# Patient Record
Sex: Male | Born: 2000
Health system: Southern US, Community
[De-identification: ages and names within clinical notes are randomized; demographics above are authoritative.]

## PROBLEM LIST (undated history)

## (undated) DIAGNOSIS — J210 Acute bronchiolitis due to respiratory syncytial virus: Secondary | ICD-10-CM

## (undated) DIAGNOSIS — L858 Other specified epidermal thickening: Secondary | ICD-10-CM

## (undated) HISTORY — PX: CIRCUMCISION: SUR203

## (undated) HISTORY — DX: Acute bronchiolitis due to respiratory syncytial virus: J21.0

## (undated) HISTORY — DX: Other specified epidermal thickening: L85.8

---
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
## 2021-03-12 DIAGNOSIS — R4182 Altered mental status, unspecified: Secondary | ICD-10-CM | POA: Diagnosis not present

## 2021-03-12 DIAGNOSIS — R000 Tachycardia, unspecified: Secondary | ICD-10-CM | POA: Diagnosis not present

## 2021-03-12 DIAGNOSIS — Z20822 Contact with and (suspected) exposure to covid-19: Secondary | ICD-10-CM | POA: Diagnosis not present

## 2021-03-12 DIAGNOSIS — F1210 Cannabis abuse, uncomplicated: Secondary | ICD-10-CM | POA: Diagnosis not present

## 2021-03-12 DIAGNOSIS — R58 Hemorrhage, not elsewhere classified: Secondary | ICD-10-CM | POA: Diagnosis not present

## 2021-03-12 DIAGNOSIS — T40715A Adverse effect of cannabis, initial encounter: Secondary | ICD-10-CM | POA: Diagnosis not present

## 2021-03-12 DIAGNOSIS — R5381 Other malaise: Secondary | ICD-10-CM | POA: Diagnosis not present

## 2021-03-12 DIAGNOSIS — R0902 Hypoxemia: Secondary | ICD-10-CM | POA: Diagnosis not present

## 2021-03-12 DIAGNOSIS — R55 Syncope and collapse: Secondary | ICD-10-CM | POA: Diagnosis not present

## 2021-03-18 ENCOUNTER — Ambulatory Visit: Payer: BC Managed Care – PPO | Admitting: Family Medicine

## 2021-03-18 ENCOUNTER — Telehealth: Payer: Self-pay | Admitting: Family Medicine

## 2021-03-18 NOTE — Telephone Encounter (Signed)
New patient appointment not needed; office visit needed to establish care.  ?

## 2021-03-18 NOTE — Telephone Encounter (Signed)
[facility] ; need to schedule new patient appointment.  ?

## 2021-03-24 ENCOUNTER — Ambulatory Visit: Payer: BC Managed Care – PPO | Admitting: Family Medicine

## 2021-03-24 ENCOUNTER — Other Ambulatory Visit: Payer: Self-pay

## 2021-03-24 ENCOUNTER — Encounter: Payer: Self-pay | Admitting: Family Medicine

## 2021-03-24 VITALS — BP 118/78 | HR 107 | Temp 97.2°F | Resp 18 | Ht 74.0 in | Wt 184.0 lb

## 2021-03-24 DIAGNOSIS — R55 Syncope and collapse: Secondary | ICD-10-CM | POA: Diagnosis not present
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
## 2021-04-07 NOTE — Progress Notes (Deleted)
?Cardiology Office Note:   ? ?Date:  04/07/2021  ? ?ID:  Darryl Curtis, DOB Jan 19, 2000, MRN [id number] ? ?PCP:  Susy Frizzle, MD ?  ?[facility] Providers ?Cardiologist:  None { ?Click to update primary MD,subspecialty MD or APP then REFRESH:1}   ? ?Referring MD: Susy Frizzle, MD  ? ?CC: *** ?Consulted for the evaluation of syncope at the behest of Susy Frizzle, MD ? ? ?History of Present Illness:   ? ?Darryl Curtis is a 21 y.o. male with a hx of syncope. ? ?Patient notes that he is feeling ***.  Has had no chest pain, chest pressure, chest tightness, chest stinging ***.  Discomfort occurs with ***, worsens with ***, and improves with ***.  Patient exertion notable for *** with *** and feels no symptoms.  No shortness of breath, DOE ***.  No PND or orthopnea***.  No weight gain***, leg swelling ***, or abdominal swelling***.  No syncope or near syncope ***. Notes *** no palpitations or funny heart beats.    ? ?Patient reports prior cardiac testing including *** ? ?No history of ***pre-eclampsia, gestation HTN or gestational DM.  No Fen-Phen or drug use***. ? ?Ambulatory BP ***. ? ? ?Past Medical History:  ?Diagnosis Date  ? Keratosis pilaris   ? Seen by Payton Mccallum- Amlactin  ? RSV bronchiolitis   ? 37 WEEKS of Age  ? ? ?Past Surgical History:  ?Procedure Laterality Date  ? CIRCUMCISION    ? ? ?Current Medications: ?No outpatient medications have been marked as taking for the 04/09/21 encounter (Appointment) with Werner Lean, MD.  ?  ? ?Allergies:   Patient has no known allergies.  ? ?Social History  ? ?Socioeconomic History  ? Marital status: Single  ?  Spouse name: Not on file  ? Number of children: Not on file  ? Years of education: Not on file  ? Highest education level: Not on file  ?Occupational History  ? Not on file  ?Tobacco Use  ? Smoking status: Former  ?  Types: E-cigarettes  ? Smokeless tobacco: Never  ? Tobacco comments:  ?  has "tried" cigarettes, ecigs, no daily/monthly use,  socially/infrequently  ?Vaping Use  ? Vaping Use: Former  ?Substance and Sexual Activity  ? Alcohol use: No  ?  Alcohol/week: 0.0 standard drinks  ? Drug use: No  ? Sexual activity: Never  ?Other Topics Concern  ? Not on file  ?Social History Narrative  ? Lives with parents and sister  ? ?Social Determinants of Health  ? ?Financial Resource Strain: Not on file  ?Food Insecurity: Not on file  ?Transportation Needs: Not on file  ?Physical Activity: Not on file  ?Stress: Not on file  ?Social Connections: Not on file  ?  ? ?Family History: ?The patient's family history includes Dementia in his maternal grandmother; Heart disease in his maternal grandfather; Hyperlipidemia in his maternal grandfather; Hypertension in his father and maternal grandfather; Stroke in his maternal grandfather. ?History of coronary artery disease notable for ***. ?History of heart failure notable for ***. ?History of arrhythmia notable for ***. ?Denies family history of sudden cardiac death including drowning, car accidents, or unexplained deaths in the family.*** ?No history of bicuspid aortic valve or aortic aneurysm or dissection.  *** ?No history of cardiomyopathies including hypertrophic cardiomyopathy, left ventricular non-compaction, or arrhythmogenic right ventricular cardiomyopathy.*** ? ?ROS:   ?Please see the history of present illness.    ?*** All other systems reviewed and are negative. ? ?  EKGs/Labs/Other Studies Reviewed:   ? ?The following studies were reviewed today: ? ?EKG:  EKG is *** ordered today.  The ekg ordered today demonstrates *** ?04/07/21: *** ?Recent Labs: ?No results found for requested labs within last 8760 hours.  ?Recent Lipid Panel ?No results found for: CHOL, TRIG, HDL, CHOLHDL, VLDL, LDLCALC, LDLDIRECT ? ?    ? ?Physical Exam:   ? ?VS:  There were no vitals taken for this visit.   ? ?Wt Readings from Last 3 Encounters:  ?03/24/21 184 lb (83.5 kg) (85 %, Z= 1.03)*  ?02/11/20 180 lb 6.4 oz (81.8 kg) (86 %, Z=  1.06)*  ?02/09/19 170 lb (77.1 kg) (82 %, Z= 0.93)*  ? ?* Growth percentiles are based on CDC (Boys, 2-20 Years) data.  ?  ? ?Gen: *** distress, *** obese/well nourished/malnourished   ?Neck: No JVD, *** carotid bruit ?Ears: Pilar Plate Sign ?Cardiac: No Rubs or Gallops, *** Murmur, ***cardia, *** radial pulses ?Respiratory: Clear to auscultation bilaterally, *** effort, ***  respiratory rate ?GI: Soft, nontender, non-distended *** ?MS: No *** edema; *** moves all extremities ?Integument: Skin feels *** ?Neuro:  At time of evaluation, alert and oriented to person/place/time/situation *** ?Psych: Normal affect, patient feels *** ? ? ?ASSESSMENT:   ? ?No diagnosis found. ?PLAN:   ? ?Syncope/Near Syncope *** ?- with/without orthostatic hypotension*** ? - diuretics management ? - SSRI's, TCAs ? - vasodilators (alpha blocker, nitrates, CCB) ? - with concomintant autonomic failure ***(pure autonomic failure, Parkinson disease, multiple system atrophy, Lewy body dementia, diabetes mellitus, amyloidosis, spinal cord injuries, autoimmune neuropathy (eg, Guillain-Barr?), paraneoplastic neuropathy) ?- with/without reflex medication hypotension ? - vasovagal syncope related to ***emotion, standing, climate *** ? - situational related to cough/sneeze/micturition/defecation/carotid stimulation *** ?-with risk of tachyarrhythmia*** ?-with risk of bradyarrhythmia*** ?-with risk of structural heart disease  ?Severe aortic stenosis ?Hypertrophic cardiomyopathy ?Cardiac tamponade ?Prosthetic valve dysfunction ?Congenital coronary anomalies ?Cardiac masses and tumors (eg, atrial myxoma) ?Pulmonary Embolism ?Pulmonary Arterial Hypertension ?- Will get Echocardiogram*** ?- Will get Live/NonLive ZioPatch or Preventice Monitor for *** ?- Will get POET for chronotropic incompetence ?- with perform ischemic evaluation: ? ? ? ? ?   ? ?{Are you ordering a CV Procedure (e.g. stress test, cath, DCCV, TEE, etc)?   Press F2        :[id number]   ? ? ?Medication Adjustments/Labs and Tests Ordered: ?Current medicines are reviewed at length with the patient today.  Concerns regarding medicines are outlined above.  ?No orders of the defined types were placed in this encounter. ? ?No orders of the defined types were placed in this encounter. ? ? ?There are no Patient Instructions on file for this visit.  ? ?Signed, ?Werner Lean, MD  ?04/07/2021 12:41 PM    ?[place] ? ?

## 2021-04-09 ENCOUNTER — Ambulatory Visit: Payer: BLUE CROSS/BLUE SHIELD | Admitting: Internal Medicine

## 2021-04-28 ENCOUNTER — Encounter: Payer: Self-pay | Admitting: *Deleted

## 2021-08-06 ENCOUNTER — Ambulatory Visit: Payer: BC Managed Care – PPO | Admitting: Family Medicine

## 2021-08-06 VITALS — BP 118/82 | Temp 97.7°F | Wt 198.0 lb

## 2021-08-06 DIAGNOSIS — L490 Exfoliation due to erythematous condition involving less than 10 percent of body surface: Secondary | ICD-10-CM | POA: Diagnosis not present

## 2021-08-06 DIAGNOSIS — L519 Erythema multiforme, unspecified: Secondary | ICD-10-CM | POA: Diagnosis not present

## 2021-08-06 MED ORDER — PREDNISONE 20 MG PO TABS: ORAL_TABLET | ORAL | 0 refills | Status: AC

## 2021-08-06 NOTE — Progress Notes (Signed)
Subjective:    Patient ID: Darryl Curtis, male    DOB: [birth year], 21 y.o.   MRN: [id number]  HPI  Patient is a very pleasant 21 year old Caucasian gentleman who presents today with a rash shown above on his elbow.  He recently got back from [place]  He is not taking any medications.  While he was in [place] he had a flulike prodrome.  He reports fatigue, headache, and malaise.  He took 1 dose of Imodium although he was not having any diarrhea.  He also took some Advil.  Shortly thereafter, he developed this rash.  He has similar lesions on his left antecubital fossa although not as pronounced.  He also has a few scattered lesions on his anterior left thigh.  There are no lesions on his torso or on his back or on his face.  He denies any cold sores.  He denies any history of genital ulcers.  He denies any cough.  He denies any shortness of breath.  Past Medical History:  Diagnosis Date   Keratosis pilaris    Seen by Payton Mccallum- Amlactin   RSV bronchiolitis    21 WEEKS of Age   Past Surgical History:  Procedure Laterality Date   CIRCUMCISION     No current outpatient medications on file prior to visit.   No current facility-administered medications on file prior to visit.   No Known Allergies Social History   Socioeconomic History   Marital status: Single    Spouse name: Not on file   Number of children: Not on file   Years of education: Not on file   Highest education level: Not on file  Occupational History   Not on file  Tobacco Use   Smoking status: Former    Types: E-cigarettes   Smokeless tobacco: Never   Tobacco comments:    has "tried" cigarettes, ecigs, no daily/monthly use, socially/infrequently  Vaping Use   Vaping Use: Former  Substance and Sexual Activity   Alcohol use: No    Alcohol/week: 0.0 standard drinks of alcohol   Drug use: No   Sexual activity: Never  Other Topics Concern   Not on file  Social History Narrative    Lives with parents and sister   Social Determinants of Health   Financial Resource Strain: Not on file  Food Insecurity: Not on file  Transportation Needs: Not on file  Physical Activity: Unknown (06/21/2017)   Exercise Vital Sign    Days of Exercise per Week: 5 days    Minutes of Exercise per Session: Not on file  Stress: No Stress Concern Present (06/21/2017)   [place]    Feeling of Stress : Not at all  Social Connections: Not on file  Intimate Partner Violence: Not on file   Family History  Problem Relation Age of Onset   Hypertension Father    Dementia Maternal Grandmother    Stroke Maternal Grandfather    Hypertension Maternal Grandfather    Hyperlipidemia Maternal Grandfather    Heart disease Maternal Grandfather      Review of Systems  All other systems reviewed and are negative.      Objective:   Physical Exam Vitals reviewed.  Constitutional:      General: He is not in acute distress.    Appearance: Normal appearance. He is normal weight. He is not ill-appearing, toxic-appearing or diaphoretic.  HENT:     Head: Normocephalic and atraumatic.  Nose: No congestion or rhinorrhea.  Neck:     Vascular: No carotid bruit.  Cardiovascular:     Rate and Rhythm: Normal rate and regular rhythm.     Pulses: Normal pulses.     Heart sounds: Normal heart sounds. No murmur heard.    No friction rub. No gallop.  Pulmonary:     Effort: Pulmonary effort is normal. No respiratory distress.     Breath sounds: Normal breath sounds. No wheezing, rhonchi or rales.  Abdominal:     General: Abdomen is flat. Bowel sounds are normal.     Palpations: Abdomen is soft.  Musculoskeletal:     Cervical back: Normal range of motion and neck supple.  Lymphadenopathy:     Cervical: No cervical adenopathy.  Skin:    Findings: Erythema and rash present.  Neurological:     Mental Status: He is alert.           Assessment & Plan:  Erythema multiforme involving less than 10% total body surface area Patient has no oral lesions.  There are no genital lesions.  Patient was not taking any medication.  Therefore I do not feel that this is a drug reaction.  Therefore the differential diagnosis includes a viral infection, mycoplasma, HSV.  He has no history of HSV.  Therefore I feel that this is most likely a reaction to a virus.  Begin prednisone for 6 days and await to see the patient's response.  Patient has no lesions on the palms.  Therefore secondary syphilis would be unlikely.  He has no history of tick bite.  Secondary early disseminated Lyme disease would be less likely.  Consider lab work for HSV, Lyme disease, and an RPR if rash persists.

## 2022-06-25 ENCOUNTER — Telehealth: Payer: Self-pay

## 2022-06-25 NOTE — Telephone Encounter (Signed)
LVM for patient to call back [phone], or to call PCP office to schedule follow up apt. AS, CMA  

## 2023-05-03 ENCOUNTER — Encounter: Payer: Self-pay | Admitting: Family Medicine

## 2023-05-03 ENCOUNTER — Ambulatory Visit: Admitting: Family Medicine

## 2023-05-03 VITALS — BP 120/76 | HR 82 | Temp 98.3°F | Ht 74.0 in | Wt 215.0 lb

## 2023-05-03 DIAGNOSIS — J069 Acute upper respiratory infection, unspecified: Secondary | ICD-10-CM

## 2023-05-03 DIAGNOSIS — J029 Acute pharyngitis, unspecified: Secondary | ICD-10-CM | POA: Diagnosis not present

## 2023-05-03 LAB — INFLUENZA A AND B AG, IMMUNOASSAY
INFLUENZA A ANTIGEN: NOT DETECTED
INFLUENZA B ANTIGEN: NOT DETECTED

## 2023-05-03 NOTE — Assessment & Plan Note (Addendum)
 No red flags on exam. No lymphadenopathy, rash, malaise, voice changes. Reassured patient that symptoms and exam findings are most consistent with a viral upper respiratory infection and explained lack of efficacy of antibiotics against viruses.  Discussed expected course and features suggestive of secondary bacterial infection.  Continue supportive care. Increase fluid intake with water or electrolyte solution like pedialyte. Encouraged acetaminophen  as needed for fever/pain. Encouraged salt water gargling, chloraseptic spray and throat lozenges. Follow up PRN

## 2023-05-03 NOTE — Progress Notes (Signed)
 Subjective:  HPI: Darryl Curtis is a 23 y.o. male presenting on 05/03/2023 for Sore Throat (Pt c/o sore throat. Pt states he has a hx of recurrent strep throat. Pt would also like to discuss a referral to an ENT. )   Sore Throat    Patient is in today for 24 hours sore throat that is painful to swallow and talk with subjective fever and chills. No voice changes, fever, body aches, cough, rhinorrhea, allergies. Does have history of recurrent strep throat.  No known sick exposures Has tried nothing.   Review of Systems  All other systems reviewed and are negative.   Relevant past medical history reviewed and updated as indicated.   Past Medical History:  Diagnosis Date   Keratosis pilaris    Seen by Ava Boatman- Amlactin   RSV bronchiolitis    55 WEEKS of Age     Past Surgical History:  Procedure Laterality Date   CIRCUMCISION      Allergies and medications reviewed and updated.   Current Outpatient Medications:    predniSONE  (DELTASONE ) 20 MG tablet, 3 tabs poqday 1-2, 2 tabs poqday 3-4, 1 tab poqday 5-6 (Patient not taking: Reported on 05/03/2023), Disp: 12 tablet, Rfl: 0  No Known Allergies  Objective:   BP 120/76   Pulse 82   Temp 98.3 F (36.8 C)   Ht 6\' 2"  (1.88 m)   Wt 215 lb (97.5 kg)   SpO2 98%   BMI 27.60 kg/m      05/03/2023   11:49 AM 08/06/2021   12:32 PM 03/24/2021    9:06 AM  Vitals with BMI  Height 6\' 2"   6\' 2"   Weight 215 lbs 198 lbs 184 lbs  BMI 27.59  23.61  Systolic 120 118 130  Diastolic 76 82 78  Pulse 82  107     Physical Exam Vitals and nursing note reviewed.  Constitutional:      Appearance: Normal appearance. He is normal weight.  HENT:     Head: Normocephalic and atraumatic.     Right Ear: Tympanic membrane, ear canal and external ear normal.     Left Ear: Tympanic membrane, ear canal and external ear normal.     Nose: Nose normal.     Mouth/Throat:     Pharynx: Posterior oropharyngeal erythema present.  Eyes:      Extraocular Movements: Extraocular movements intact.     Conjunctiva/sclera: Conjunctivae normal.     Pupils: Pupils are equal, round, and reactive to light.  Musculoskeletal:     Cervical back: No tenderness.  Lymphadenopathy:     Cervical: No cervical adenopathy.  Skin:    General: Skin is warm and dry.     Capillary Refill: Capillary refill takes less than 2 seconds.  Neurological:     General: No focal deficit present.     Mental Status: He is alert and oriented to person, place, and time. Mental status is at baseline.  Psychiatric:        Mood and Affect: Mood normal.        Behavior: Behavior normal.        Thought Content: Thought content normal.        Judgment: Judgment normal.     Assessment & Plan:  Viral URI Assessment & Plan: No red flags on exam. No lymphadenopathy, rash, malaise, voice changes. Reassured patient that symptoms and exam findings are most consistent with a viral upper respiratory infection and explained lack of efficacy of antibiotics  against viruses.  Discussed expected course and features suggestive of secondary bacterial infection.  Continue supportive care. Increase fluid intake with water or electrolyte solution like pedialyte. Encouraged acetaminophen  as needed for fever/pain. Encouraged salt water gargling, chloraseptic spray and throat lozenges. Follow up PRN   Sore throat -     STREP GROUP A AG, W/REFLEX TO CULT -     Culture, Group A Strep -     Influenza A and B Ag, Immunoassay     Follow up plan: Return if symptoms worsen or fail to improve.  Jenelle Mis, FNP

## 2023-05-05 LAB — CULTURE, GROUP A STREP
Micro Number: 16359059
SPECIMEN QUALITY:: ADEQUATE

## 2023-05-05 LAB — STREP GROUP A AG, W/REFLEX TO CULT: Streptococcus Group A AG: NOT DETECTED
